# Patient Record
Sex: Male | Born: 1990 | Race: White | Hispanic: No | Marital: Single | Smoking: Current every day smoker
Health system: Southern US, Community
[De-identification: ages and names within clinical notes are randomized; demographics above are authoritative.]

## PROBLEM LIST (undated history)

## (undated) DIAGNOSIS — F319 Bipolar disorder, unspecified: Secondary | ICD-10-CM

## (undated) HISTORY — PX: KNEE SURGERY: SHX244

---
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
## 2015-11-13 ENCOUNTER — Emergency Department (HOSPITAL_COMMUNITY)
Admission: EM | Admit: 2015-11-13 | Discharge: 2015-11-14 | Disposition: A | Discharge status: Home / Self Care | Payer: Medicaid Other | Attending: Emergency Medicine | Admitting: Emergency Medicine

## 2015-11-13 ENCOUNTER — Encounter (HOSPITAL_COMMUNITY): Payer: Self-pay | Admitting: Emergency Medicine

## 2015-11-13 DIAGNOSIS — G478 Other sleep disorders: Secondary | ICD-10-CM | POA: Diagnosis not present

## 2015-11-13 DIAGNOSIS — F17200 Nicotine dependence, unspecified, uncomplicated: Secondary | ICD-10-CM | POA: Diagnosis not present

## 2015-11-13 DIAGNOSIS — F1210 Cannabis abuse, uncomplicated: Secondary | ICD-10-CM | POA: Diagnosis not present

## 2015-11-13 DIAGNOSIS — F4325 Adjustment disorder with mixed disturbance of emotions and conduct: Secondary | ICD-10-CM | POA: Diagnosis present

## 2015-11-13 DIAGNOSIS — R45851 Suicidal ideations: Secondary | ICD-10-CM | POA: Diagnosis present

## 2015-11-13 HISTORY — DX: Bipolar disorder, unspecified: F31.9

## 2015-11-13 LAB — COMPREHENSIVE METABOLIC PANEL
ALBUMIN: 4.4 g/dL (ref 3.5–5.0)
ALT: 22 U/L (ref 17–63)
AST: 27 U/L (ref 15–41)
Alkaline Phosphatase: 65 U/L (ref 38–126)
Anion gap: 11 (ref 5–15)
BUN: 15 mg/dL (ref 6–20)
CHLORIDE: 109 mmol/L (ref 101–111)
CO2: 25 mmol/L (ref 22–32)
Calcium: 9.3 mg/dL (ref 8.9–10.3)
Creatinine, Ser: 0.98 mg/dL (ref 0.61–1.24)
GFR calc Af Amer: >60 mL/min (ref >60)
GFR calc non Af Amer: >60 mL/min (ref >60)
GLUCOSE: 88 mg/dL (ref 65–99)
POTASSIUM: 3.7 mmol/L (ref 3.5–5.1)
SODIUM: 145 mmol/L (ref 135–145)
Total Bilirubin: 0.9 mg/dL (ref 0.3–1.2)
Total Protein: 7.1 g/dL (ref 6.5–8.1)

## 2015-11-13 LAB — CBC
HEMATOCRIT: 40.3 % (ref 39.0–52.0)
HEMOGLOBIN: 13.5 g/dL (ref 13.0–17.0)
MCH: 29.4 pg (ref 26.0–34.0)
MCHC: 33.5 g/dL (ref 30.0–36.0)
MCV: 87.8 fL (ref 78.0–100.0)
Platelets: 183 10*3/uL (ref 150–400)
RBC: 4.59 MIL/uL (ref 4.22–5.81)
RDW: 13.1 % (ref 11.5–15.5)
WBC: 10 10*3/uL (ref 4.0–10.5)

## 2015-11-13 LAB — RAPID URINE DRUG SCREEN, HOSP PERFORMED
Amphetamines: NOT DETECTED
BARBITURATES: NOT DETECTED
Benzodiazepines: NOT DETECTED
Cocaine: NOT DETECTED
Opiates: NOT DETECTED
TETRAHYDROCANNABINOL: POSITIVE — AB

## 2015-11-13 LAB — SALICYLATE LEVEL

## 2015-11-13 LAB — ACETAMINOPHEN LEVEL: Acetaminophen (Tylenol), Serum: <10 ug/mL — ABNORMAL LOW (ref 10–30)

## 2015-11-13 LAB — ETHANOL: Alcohol, Ethyl (B): <5 mg/dL (ref <5)

## 2015-11-13 NOTE — ED Notes (Signed)
Pt brought in by [employer] voluntarily  Pt states he does not care whether or not he lives anymore  Police say there was a family dispute tonight at the residence  Pt told police they could either take him to jail or kill him  Pt is very vague with his answers in triage  Avoids eye contact

## 2015-11-14 DIAGNOSIS — F4325 Adjustment disorder with mixed disturbance of emotions and conduct: Secondary | ICD-10-CM

## 2015-11-14 MED ORDER — ZOLPIDEM TARTRATE 5 MG PO TABS: 5.0000 mg | ORAL_TABLET | Freq: Every evening | ORAL | Status: DC | PRN

## 2015-11-14 MED ORDER — ACETAMINOPHEN 325 MG PO TABS: 650.0000 mg | ORAL_TABLET | ORAL | Status: DC | PRN

## 2015-11-14 MED ORDER — LORAZEPAM 1 MG PO TABS: 1.0000 mg | ORAL_TABLET | Freq: Three times a day (TID) | ORAL | Status: DC | PRN

## 2015-11-14 MED ORDER — ONDANSETRON HCL 4 MG PO TABS
4.0000 mg | ORAL_TABLET | Freq: Three times a day (TID) | ORAL | Status: DC | PRN
Start: 2015-11-14 — End: 2015-11-14

## 2015-11-14 NOTE — Consult Note (Signed)
[facility] Face-to-Face Psychiatry Consult   Reason for Consult:  Suicidal ideations Referring Physician:  EDP Patient Identification: Benjamin Wise MRN:  [id number] Principal Diagnosis: Adjustment disorder with mixed disturbance of emotions and conduct Diagnosis:   Patient Active Problem List   Diagnosis Date Noted  . Adjustment disorder with mixed disturbance of emotions and conduct [F43.25] 11/14/2015    Priority: High    Total Time spent with patient: 45 minutes  Subjective:   Benjamin Wise is a 25 y.o. male patient does not warrant admission.  HPI:  25 yo who presented to the ED with suicidal ideations after having an argument with his family, no prior psychiatric or drug/alcohol history.  He is currently living with his mother and stepfather as he and his wife are going through a divorce, does have joint custody with his 22 month son.  Today, he denies suicidal/homicidal ideations, no past attempts, no hallucinations, and no alcohol/drug abuse.  Encouraged him to go to therapy but he feels he does not need it.  He wants to leave and return to work.  Stable for discharge.  Past Psychiatric History: None  Risk to Self: Suicidal Ideation: Yes-Currently Present Suicidal Intent: Yes-Currently Present Is patient at risk for suicide?: Yes Suicidal Plan?: Yes-Currently Present Specify Current Suicidal Plan: Pt told the police officer to kill him.  Access to Means: Yes What has been your use of drugs/alcohol within the last 12 months?: Pt denies; however UDS is positive for THC.  How many times?: 2 Other Self Harm Risks: Pt denies  Triggers for Past Attempts: Unpredictable Intentional Self Injurious Behavior: None Risk to Others: Homicidal Ideation: No Thoughts of Harm to Others: No Current Homicidal Intent: No Current Homicidal Plan: No Access to Homicidal Means: No Identified Victim: N/A History of harm to others?: No Assessment of Violence: None Noted Violent Behavior  Description: No violent behaviors observed.  Does patient have access to weapons?: No Criminal Charges Pending?: No Does patient have a court date: No Prior Inpatient Therapy: Prior Inpatient Therapy: No Prior Therapy Dates: N/A Prior Therapy Facilty/Provider(s): N/A Reason for Treatment: N/A Prior Outpatient Therapy: Prior Outpatient Therapy: No Prior Therapy Dates: N/A Prior Therapy Facilty/Provider(s): N/A Reason for Treatment: N/A Does patient have an ACCT team?: No Does patient have Intensive In-House Services?  : No Does patient have [facility] services? : No Does patient have P4CC services?: No  Past Medical History:  Past Medical History  Diagnosis Date  . Bipolar disorder [facility])     Past Surgical History  Procedure Laterality Date  . Knee surgery     Family History:  Family History  Problem Relation Age of Onset  . Anxiety disorder Mother    Family Psychiatric  History: None Social History:  History  Alcohol Use No     History  Drug Use  . Yes  . Special: Marijuana    Social History   Social History  . Marital Status: Single    Spouse Name: N/A  . Number of Children: N/A  . Years of Education: N/A   Social History Main Topics  . Smoking status: Current Every Day Smoker  . Smokeless tobacco: None  . Alcohol Use: No  . Drug Use: Yes    Special: Marijuana  . Sexual Activity: Not Asked   Other Topics Concern  . None   Social History Narrative   Additional Social History:    Allergies:  No Known Allergies  Labs:  Results for orders placed or performed during  the hospital encounter of 11/13/15 (from the past 48 hour(s))  Comprehensive metabolic panel     Status: None   Collection Time: 11/13/15 10:51 PM  Result Value Ref Range   Sodium 145 135 - 145 mmol/L   Potassium 3.7 3.5 - 5.1 mmol/L   Chloride 109 101 - 111 mmol/L   CO2 25 22 - 32 mmol/L   Glucose, Bld 88 65 - 99 mg/dL   BUN 15 6 - 20 mg/dL   Creatinine, Ser 0.98 0.61 - 1.24 mg/dL    Calcium 9.3 8.9 - 10.3 mg/dL   Total Protein 7.1 6.5 - 8.1 g/dL   Albumin 4.4 3.5 - 5.0 g/dL   AST 27 15 - 41 U/L   ALT 22 17 - 63 U/L   Alkaline Phosphatase 65 38 - 126 U/L   Total Bilirubin 0.9 0.3 - 1.2 mg/dL   GFR calc non Af Amer >60 >60 mL/min   GFR calc Af Amer >60 >60 mL/min    Comment: (NOTE) The eGFR has been calculated using the CKD EPI equation. This calculation has not been validated in all clinical situations. eGFR's persistently <60 mL/min signify possible Chronic Kidney Disease.    Anion gap 11 5 - 15  Ethanol (ETOH)     Status: None   Collection Time: 11/13/15 10:51 PM  Result Value Ref Range   Alcohol, Ethyl (B) <5 <5 mg/dL    Comment:        LOWEST DETECTABLE LIMIT FOR SERUM ALCOHOL IS 5 mg/dL FOR MEDICAL PURPOSES ONLY   Salicylate level     Status: None   Collection Time: 11/13/15 10:51 PM  Result Value Ref Range   Salicylate Lvl <2.2 2.8 - 30.0 mg/dL  Acetaminophen level     Status: Abnormal   Collection Time: 11/13/15 10:51 PM  Result Value Ref Range   Acetaminophen (Tylenol), Serum <10 (L) 10 - 30 ug/mL    Comment:        THERAPEUTIC CONCENTRATIONS VARY SIGNIFICANTLY. A RANGE OF 10-30 ug/mL MAY BE AN EFFECTIVE CONCENTRATION FOR MANY PATIENTS. HOWEVER, SOME ARE BEST TREATED AT CONCENTRATIONS OUTSIDE THIS RANGE. ACETAMINOPHEN CONCENTRATIONS >150 ug/mL AT 4 HOURS AFTER INGESTION AND >50 ug/mL AT 12 HOURS AFTER INGESTION ARE OFTEN ASSOCIATED WITH TOXIC REACTIONS.   CBC     Status: None   Collection Time: 11/13/15 10:51 PM  Result Value Ref Range   WBC 10.0 4.0 - 10.5 K/uL   RBC 4.59 4.22 - 5.81 MIL/uL   Hemoglobin 13.5 13.0 - 17.0 g/dL   HCT 40.3 39.0 - 52.0 %   MCV 87.8 78.0 - 100.0 fL   MCH 29.4 26.0 - 34.0 pg   MCHC 33.5 30.0 - 36.0 g/dL   RDW 13.1 11.5 - 15.5 %   Platelets 183 150 - 400 K/uL  Urine rapid drug screen (hosp performed) (Not at [facility])     Status: Abnormal   Collection Time: 11/13/15 11:12 PM  Result Value Ref Range    Opiates NONE DETECTED NONE DETECTED   Cocaine NONE DETECTED NONE DETECTED   Benzodiazepines NONE DETECTED NONE DETECTED   Amphetamines NONE DETECTED NONE DETECTED   Tetrahydrocannabinol POSITIVE (A) NONE DETECTED   Barbiturates NONE DETECTED NONE DETECTED    Comment:        DRUG SCREEN FOR MEDICAL PURPOSES ONLY.  IF CONFIRMATION IS NEEDED FOR ANY PURPOSE, NOTIFY LAB WITHIN 5 DAYS.        LOWEST DETECTABLE LIMITS FOR URINE DRUG SCREEN Drug Class  Cutoff (ng/mL) Amphetamine      1000 Barbiturate      200 Benzodiazepine   992 Tricyclics       426 Opiates          300 Cocaine          300 THC              50     Current Facility-Administered Medications  Medication Dose Route Frequency Provider Last Rate Last Dose  . acetaminophen (TYLENOL) tablet 650 mg  650 mg Oral Q4H PRN Ankit Nanavati, MD      . ondansetron (ZOFRAN) tablet 4 mg  4 mg Oral Q8H PRN Ankit Nanavati, MD      . zolpidem (AMBIEN) tablet 5 mg  5 mg Oral QHS PRN Varney Biles, MD       No current outpatient prescriptions on file.    Musculoskeletal: Strength & Muscle Tone: within normal limits Gait & Station: normal Patient leans: N/A  Psychiatric Specialty Exam: Review of Systems  Constitutional: Negative.   HENT: Negative.   Eyes: Negative.   Respiratory: Negative.   Cardiovascular: Negative.   Gastrointestinal: Negative.   Genitourinary: Negative.   Musculoskeletal: Negative.   Skin: Negative.   Neurological: Negative.   Endo/Heme/Allergies: Negative.   Psychiatric/Behavioral: Negative.     Blood pressure 126/77, pulse 54, temperature 98.2 F (36.8 C), temperature source Oral, resp. rate 18, SpO2 100 %.There is no height or weight on file to calculate BMI.  General Appearance: Casual  Eye Contact::  Good  Speech:  Normal Rate  Volume:  Normal  Mood:  Euthymic  Affect:  Congruent  Thought Process:  Coherent  Orientation:  Full (Time, Place, and Person)  Thought Content:  WDL  Suicidal  Thoughts:  No  Homicidal Thoughts:  No  Memory:  Immediate;   Good Recent;   Good Remote;   Good  Judgement:  Fair  Insight:  Fair  Psychomotor Activity:  Normal  Concentration:  Good  Recall:  Good  Fund of Knowledge:Good  Language: Good  Akathisia:  No  Handed:  Right  AIMS (if indicated):     Assets:  Housing Leisure Time Physical Health Resilience Social Support  ADL's:  Intact  Cognition: WNL  Sleep:      Treatment Plan Summary: Daily contact with patient to assess and evaluate symptoms and progress in treatment, Medication management and Plan :  Adjustment disorder with mixed disturbance of emotions and conduct: -Crisis stabilization -No medication recommendation nor does he want any -Individual counseling -Outpatient therapy resources provided with encouragement to attend  Disposition: No evidence of imminent risk to self or others at present.    Waylan Boga, NP 11/14/2015 10:19 AM

## 2015-11-14 NOTE — BH Assessment (Addendum)
Tele Assessment Note   Benjamin Wise is an 25 y.o. male presenting to [facility] reporting suicidal ideations. Pt denies having a plan at this time; however ti has been documented that pt told police officers to either take him to jail or kill him. Pt stated "I got into an argument with some people and I think it would be better if I wasn't here". Pt reported that he has attempted suicide in the past but did not report any previous mental health treatment. PT did not report any issues with his sleep or appetite but reported the following symptoms: sadness, tearfulness, isolation, fatigue, guilt and feeling worthless. Pt denies HI and AVH at this time. Pt denied having access to firearms and did not report any pending criminal charges or upcoming court dates. Pt denied any alcohol or illicit substance abuse; however his UDS is positive for THC. Pt did not report any physical, sexual or emotional abuse at this time.  Inpatient treatment is recommended.  Diagnosis: Bipolar I disorder, current or most recent episode depressed   Past Medical History:  Past Medical History  Diagnosis Date  . Bipolar disorder [facility])     Past Surgical History  Procedure Laterality Date  . Knee surgery      Family History:  Family History  Problem Relation Age of Onset  . Anxiety disorder Mother     Social History:  reports that he has been smoking.  He does not have any smokeless tobacco history on file. He reports that he uses illicit drugs (Marijuana). He reports that he does not drink alcohol.  Additional Social History:  Alcohol / Drug Use History of alcohol / drug use?: No history of alcohol / drug abuse (Pt denies but UDS is positive for THC. )  CIWA: CIWA-Ar BP: 111/66 mmHg Pulse Rate: 86 COWS:    PATIENT STRENGTHS: (choose at least two) Average or above average intelligence Physical Health  Allergies: No Known Allergies  Home Medications:  (Not in a [facility] admission)  OB/GYN Status:  No LMP  for male patient.  General Assessment Data Location of Assessment: [facility] TTS Assessment: In system Is this a Tele or Face-to-Face Assessment?: Face-to-Face Is this an Initial Assessment or a Re-assessment for this encounter?: Initial Assessment Living Arrangements: Non-relatives/Friends Can pt return to current living arrangement?: Yes Admission Status: Voluntary Is patient capable of signing voluntary admission?: Yes Referral Source: Self/Family/Friend Insurance type: Medicaid      Crisis Care Plan Living Arrangements: Non-relatives/Friends Name of Psychiatrist: No provider reported Name of Therapist: No provider reported   Education Status Is patient currently in school?: No Current Grade: N/A Highest grade of school patient has completed: N/A Name of school: N/A Contact person: N/A  Risk to self with the past 6 months Suicidal Ideation: Yes-Currently Present Has patient been a risk to self within the past 6 months prior to admission? : No Suicidal Intent: Yes-Currently Present Has patient had any suicidal intent within the past 6 months prior to admission? : No Is patient at risk for suicide?: Yes Suicidal Plan?: Yes-Currently Present Has patient had any suicidal plan within the past 6 months prior to admission? : No Specify Current Suicidal Plan: Pt told the police officer to kill him.  Access to Means: Yes What has been your use of drugs/alcohol within the last 12 months?: Pt denies; however UDS is positive for THC.  Previous Attempts/Gestures: Yes How many times?: 2 Other Self Harm Risks: Pt denies  Triggers for Past Attempts:  Unpredictable Intentional Self Injurious Behavior: None Family Suicide History: No Recent stressful life event(s): Conflict (Comment) (conflict with others in the home ) Persecutory voices/beliefs?: No Depression: Yes Depression Symptoms: Despondent, Isolating, Tearfulness, Fatigue, Guilt, Feeling worthless/self pity Substance abuse  history and/or treatment for substance abuse?: Yes Suicide prevention information given to non-admitted patients: Not applicable  Risk to Others within the past 6 months Homicidal Ideation: No Does patient have any lifetime risk of violence toward others beyond the six months prior to admission? : No Thoughts of Harm to Others: No Current Homicidal Intent: No Current Homicidal Plan: No Access to Homicidal Means: No Identified Victim: N/A History of harm to others?: No Assessment of Violence: None Noted Violent Behavior Description: No violent behaviors observed.  Does patient have access to weapons?: No Criminal Charges Pending?: No Does patient have a court date: No Is patient on probation?: No  Psychosis Hallucinations: None noted Delusions: None noted  Mental Status Report Appearance/Hygiene: In scrubs Eye Contact: Poor Motor Activity: Freedom of movement Speech: Logical/coherent Level of Consciousness: Quiet/awake Mood: Depressed, Sad Affect: Depressed, Sad Anxiety Level: Minimal Thought Processes: Coherent, Relevant Judgement: Unimpaired Orientation: Appropriate for developmental age Obsessive Compulsive Thoughts/Behaviors: None  Cognitive Functioning Concentration: Normal Memory: Recent Intact, Remote Intact IQ: Average Insight: Poor Impulse Control: Poor Appetite: Good Weight Loss: 0 Weight Gain: 0 Sleep: No Change Total Hours of Sleep: 8 Vegetative Symptoms: None  ADLScreening [facility] Assessment Services) Patient's cognitive ability adequate to safely complete daily activities?: Yes Patient able to express need for assistance with ADLs?: Yes Independently performs ADLs?: Yes (appropriate for developmental age)  Prior Inpatient Therapy Prior Inpatient Therapy: No Prior Therapy Dates: N/A Prior Therapy Facilty/Provider(s): N/A Reason for Treatment: N/A  Prior Outpatient Therapy Prior Outpatient Therapy: No Prior Therapy Dates: N/A Prior Therapy  Facilty/Provider(s): N/A Reason for Treatment: N/A Does patient have an ACCT team?: No Does patient have Intensive In-House Services?  : No Does patient have [facility] services? : No Does patient have P4CC services?: No  ADL Screening (condition at time of admission) Patient's cognitive ability adequate to safely complete daily activities?: Yes Is the patient deaf or have difficulty hearing?: No Does the patient have difficulty seeing, even when wearing glasses/contacts?: No Does the patient have difficulty concentrating, remembering, or making decisions?: No Patient able to express need for assistance with ADLs?: Yes Does the patient have difficulty dressing or bathing?: No Independently performs ADLs?: Yes (appropriate for developmental age)       Abuse/Neglect Assessment (Assessment to be complete while patient is alone) Physical Abuse: Denies Verbal Abuse: Denies Sexual Abuse: Denies Exploitation of patient/patient's resources: Denies Self-Neglect: Denies     Merchant [employer] officer (For Healthcare) Does patient have an advance directive?: No Would patient like information on creating an advanced directive?: No - patient declined information    Additional Information 1:1 In Past 12 Months?: No CIRT Risk: No Elopement Risk: No Does patient have medical clearance?: Yes     Disposition:  Disposition Initial Assessment Completed for this Encounter: Yes Disposition of Patient: Inpatient treatment program Type of inpatient treatment program: Adult  Anyae Griffith S 11/14/2015 1:20 AM

## 2015-11-14 NOTE — ED Notes (Signed)
Pt discharged ambulatory.  Discharge instructions reviewed and all belongings were returned to patient.

## 2015-11-14 NOTE — ED Notes (Signed)
Patient appears anxious, withdrawn. Hesitant to answer questions, not making eye contact. Denies SI, HI, AVH. Very hesitant in answering to HI. Reports VH approx. a year ago. Denies feelings of anxiety. States that he has had depression for years. Denies any changes in weight recently. Reports that he does not feel rested after sleep.   Oriented to the unit. TTS in with patient.  Q 15 safety checks in place.

## 2015-11-14 NOTE — [facility] Suicide Risk Assessment (Signed)
Suicide Risk Assessment  Discharge Assessment   [facility] Discharge Suicide Risk Assessment   Principal Problem: Adjustment disorder with mixed disturbance of emotions and conduct Discharge Diagnoses:  Patient Active Problem List   Diagnosis Date Noted  . Adjustment disorder with mixed disturbance of emotions and conduct [F43.25] 11/14/2015    Priority: High    Total Time spent with patient: 45 minutes  Musculoskeletal: Strength & Muscle Tone: within normal limits Gait & Station: normal Patient leans: N/A  Psychiatric Specialty Exam: Review of Systems  Constitutional: Negative.   HENT: Negative.   Eyes: Negative.   Respiratory: Negative.   Cardiovascular: Negative.   Gastrointestinal: Negative.   Genitourinary: Negative.   Musculoskeletal: Negative.   Skin: Negative.   Neurological: Negative.   Endo/Heme/Allergies: Negative.   Psychiatric/Behavioral: Negative.     Blood pressure 126/77, pulse 54, temperature 98.2 F (36.8 C), temperature source Oral, resp. rate 18, SpO2 100 %.There is no height or weight on file to calculate BMI.  General Appearance: Casual  Eye Contact::  Good  Speech:  Normal Rate  Volume:  Normal  Mood:  Euthymic  Affect:  Congruent  Thought Process:  Coherent  Orientation:  Full (Time, Place, and Person)  Thought Content:  WDL  Suicidal Thoughts:  No  Homicidal Thoughts:  No  Memory:  Immediate;   Good Recent;   Good Remote;   Good  Judgement:  Fair  Insight:  Fair  Psychomotor Activity:  Normal  Concentration:  Good  Recall:  Good  Fund of Knowledge:Good  Language: Good  Akathisia:  No  Handed:  Right  AIMS (if indicated):     Assets:  Housing Leisure Time Physical Health Resilience Social Support  ADL's:  Intact  Cognition: WNL  Sleep:        Mental Status Per Nursing Assessment::   On Admission:   Suicidal ideations  Demographic Factors:  Male  Loss Factors: Loss of significant relationship  Historical  Factors: NA  Risk Reduction Factors:   Responsible for children under 47 years of age, Sense of responsibility to family, Employed, Living with another person, especially a relative, Positive social support and Positive coping skills or problem solving skills  Continued Clinical Symptoms:  None  Cognitive Features That Contribute To Risk:  None    Suicide Risk:  Minimal: No identifiable suicidal ideation.  Patients presenting with no risk factors but with morbid ruminations; may be classified as minimal risk based on the severity of the depressive symptoms    Plan Of Care/Follow-up recommendations:  Activity:  as tolerated Diet:  heart healthy diet  LORD, JAMISON, NP 11/14/2015, 10:27 AM

## 2015-11-14 NOTE — Discharge Instructions (Signed)
  Adjustment Disorder Adjustment disorder is an unusually severe reaction to a stressful life event, such as the loss of a job or physical illness. The event may be any stressful event other than the loss of a loved one. Adjustment disorder may affect your feelings, your thinking, how you act, or a combination of these. It may interfere with personal relationships or with the way you are at work, school, or home. People with this disorder are at risk for suicide and substance abuse. They may develop a more serious mental disorder, such as major depressive disorder or post-traumatic stress disorder. SIGNS AND SYMPTOMS  Symptoms may include:  Sadness, depressed mood, or crying spells.  Loss of enjoyment.  Change in appetite or weight.  Sense of loss or hopelessness.  Thoughts of suicide.  Anxiety, worry, or nervousness.  Trouble sleeping.  Avoiding family and friends.  Poor school performance.  Fighting or vandalism.  Reckless driving.  Skipping school.  Poor work performance.  Ignoring bills. Symptoms of adjustment disorder start within 3 months of the stressful life event. They do not last more than 6 months after the event has ended. DIAGNOSIS  To make a diagnosis, your health care provider will ask about what has happened in your life and how it has affected you. He or she may also ask about your medical history and use of medicines, alcohol, and other substances. Your health care provider may do a physical exam and order lab tests or other studies. You may be referred to a mental health specialist for evaluation. TREATMENT  Treatment options include:  Counseling or talk therapy. Talk therapy is usually provided by mental health specialists.  Medicine. Certain medicines may help with depression, anxiety, and sleep.  Support groups. Support groups offer emotional support, advice, and guidance. They are made up of people who have had similar experiences. HOME CARE  INSTRUCTIONS  Keep all follow-up visits as directed by your health care provider. This is important.  Take medicines only as directed by your health care provider. SEEK MEDICAL CARE IF:  Your symptoms get worse.  SEEK IMMEDIATE MEDICAL CARE IF: You have serious thoughts about hurting yourself or someone else. MAKE SURE YOU:  Understand these instructions.  Will watch your condition.  Will get help right away if you are not doing well or get worse.   This information is not intended to replace advice given to you by your health care provider. Make sure you discuss any questions you have with your health care provider.   Document Released: 05/11/2006 Document Revised: 09/27/2014 Document Reviewed: 01/29/2014 Elsevier Interactive Patient Education 2016 [employer]  

## 2015-11-14 NOTE — BH Assessment (Signed)
Assessment completed. Consulted  Hulan Fess, NP who recommended inpatient treatment. TTS to seek placement. Dr. Rhunette Croft of the recommendation.

## 2015-11-14 NOTE — ED Provider Notes (Signed)
CSN: [id number]     Arrival date & time 11/13/15  2100 History   First MD Initiated Contact with Patient 11/13/15 2134     Chief Complaint  Patient presents with  . Suicidal     (Consider location/radiation/quality/duration/timing/severity/associated sxs/prior Treatment) HPI Comments: PT comes in VOLUNTARILY with SI. Pt has hx of depression and bipolar disorder. Pt reports that his wife started his sleeping with another man, and she will likely file for divorce and get child custody -which has made his symptoms worse. He always has been depressed, but now he really feels hopeless.  The history is provided by the patient.    Past Medical History  Diagnosis Date  . Bipolar disorder [facility])    Past Surgical History  Procedure Laterality Date  . Knee surgery     Family History  Problem Relation Age of Onset  . Anxiety disorder Mother    Social History  Substance Use Topics  . Smoking status: Current Every Day Smoker  . Smokeless tobacco: None  . Alcohol Use: No    Review of Systems  Constitutional: Negative for activity change.  Cardiovascular: Negative for chest pain.  Gastrointestinal: Negative for nausea.  Psychiatric/Behavioral: Positive for suicidal ideas and sleep disturbance. Negative for self-injury.      Allergies  Review of patient's allergies indicates no known allergies.  Home Medications   Prior to Admission medications   Not on File   BP 109/65 mmHg  Pulse 73  Temp(Src) 98 F (36.7 C) (Oral)  Resp 16  SpO2 98% Physical Exam  Constitutional: He is oriented to person, place, and time. He appears well-developed.  HENT:  Head: Atraumatic.  Neck: Neck supple.  Cardiovascular: Normal rate.   Pulmonary/Chest: Effort normal.  Neurological: He is alert and oriented to person, place, and time.  Skin: Skin is warm.  Psychiatric:  Flat affect  Nursing note and vitals reviewed.   ED Course  Procedures (including critical care time) Labs  Review Labs Reviewed  ACETAMINOPHEN LEVEL - Abnormal; Notable for the following:    Acetaminophen (Tylenol), Serum <10 (*)    All other components within normal limits  URINE RAPID DRUG SCREEN, HOSP PERFORMED - Abnormal; Notable for the following:    Tetrahydrocannabinol POSITIVE (*)    All other components within normal limits  COMPREHENSIVE METABOLIC PANEL  ETHANOL  SALICYLATE LEVEL  CBC    Imaging Review No results found. I have personally reviewed and evaluated these images and lab results as part of my medical decision-making.   EKG Interpretation None      MDM   Final diagnoses:  Suicidal ideation    PT comes in with cc of SI. He has flat affect. Will need TTS evaluation. Medically cleared.     Derwood Kaplan, MD 11/14/15 [id number]

## 2015-11-14 NOTE — ED Notes (Signed)
Bed: [facility] Expected date: 11/14/15 Expected time: 12:37 AM Means of arrival:  Comments: Hold for [facility]

## 2018-09-27 ENCOUNTER — Emergency Department (HOSPITAL_COMMUNITY)
Admission: EM | Admit: 2018-09-27 | Discharge: 2018-09-27 | Disposition: A | Discharge status: Home / Self Care | Payer: Self-pay | Attending: Emergency Medicine | Admitting: Emergency Medicine

## 2018-09-27 ENCOUNTER — Other Ambulatory Visit: Payer: Self-pay

## 2018-09-27 ENCOUNTER — Emergency Department (HOSPITAL_COMMUNITY): Payer: Self-pay

## 2018-09-27 ENCOUNTER — Encounter (HOSPITAL_COMMUNITY): Payer: Self-pay | Admitting: Emergency Medicine

## 2018-09-27 DIAGNOSIS — R1031 Right lower quadrant pain: Secondary | ICD-10-CM | POA: Insufficient documentation

## 2018-09-27 DIAGNOSIS — F1210 Cannabis abuse, uncomplicated: Secondary | ICD-10-CM | POA: Insufficient documentation

## 2018-09-27 DIAGNOSIS — F17200 Nicotine dependence, unspecified, uncomplicated: Secondary | ICD-10-CM | POA: Insufficient documentation

## 2018-09-27 DIAGNOSIS — L0291 Cutaneous abscess, unspecified: Secondary | ICD-10-CM | POA: Insufficient documentation

## 2018-09-27 LAB — CBC WITH DIFFERENTIAL/PLATELET
Abs Immature Granulocytes: 0.03 10*3/uL (ref 0.00–0.07)
Basophils Absolute: 0.2 10*3/uL — ABNORMAL HIGH (ref 0.0–0.1)
Basophils Relative: 2 %
EOS PCT: 2 %
Eosinophils Absolute: 0.2 10*3/uL (ref 0.0–0.5)
HEMATOCRIT: 44.4 % (ref 39.0–52.0)
HEMOGLOBIN: 14.7 g/dL (ref 13.0–17.0)
Immature Granulocytes: 0 %
LYMPHS ABS: 2.2 10*3/uL (ref 0.7–4.0)
LYMPHS PCT: 22 %
MCH: 30.2 pg (ref 26.0–34.0)
MCHC: 33.1 g/dL (ref 30.0–36.0)
MCV: 91.2 fL (ref 80.0–100.0)
MONO ABS: 1.1 10*3/uL — AB (ref 0.1–1.0)
Monocytes Relative: 12 %
Neutro Abs: 6.1 10*3/uL (ref 1.7–7.7)
Neutrophils Relative %: 62 %
Platelets: 144 10*3/uL — ABNORMAL LOW (ref 150–400)
RBC: 4.87 MIL/uL (ref 4.22–5.81)
RDW: 12.3 % (ref 11.5–15.5)
WBC: 9.8 10*3/uL (ref 4.0–10.5)
nRBC: 0 % (ref 0.0–0.2)

## 2018-09-27 LAB — COMPREHENSIVE METABOLIC PANEL
ALK PHOS: 50 U/L (ref 38–126)
ALT: 22 U/L (ref 0–44)
AST: 23 U/L (ref 15–41)
Albumin: 3.9 g/dL (ref 3.5–5.0)
Anion gap: 7 (ref 5–15)
BUN: 16 mg/dL (ref 6–20)
CALCIUM: 9.3 mg/dL (ref 8.9–10.3)
CO2: 23 mmol/L (ref 22–32)
Chloride: 107 mmol/L (ref 98–111)
Creatinine, Ser: 0.76 mg/dL (ref 0.61–1.24)
GFR calc non Af Amer: >60 mL/min (ref >60)
Glucose, Bld: 91 mg/dL (ref 70–99)
Potassium: 4.5 mmol/L (ref 3.5–5.1)
SODIUM: 137 mmol/L (ref 135–145)
Total Bilirubin: 0.7 mg/dL (ref 0.3–1.2)
Total Protein: 6.8 g/dL (ref 6.5–8.1)

## 2018-09-27 MED ORDER — HYDROCODONE-ACETAMINOPHEN 5-325 MG PO TABS
1.0000 | ORAL_TABLET | Freq: Once | ORAL | Status: AC | PRN
  Administered 2018-09-27: 1 via ORAL
  Filled 2018-09-27: qty 1

## 2018-09-27 MED ORDER — DOXYCYCLINE HYCLATE 100 MG PO CAPS: 100.0000 mg | ORAL_CAPSULE | Freq: Two times a day (BID) | ORAL | 0 refills | Status: AC

## 2018-09-27 MED ORDER — IOHEXOL 300 MG/ML  SOLN
100.0000 mL | Freq: Once | INTRAMUSCULAR | Status: AC | PRN
  Administered 2018-09-27: 100 mL via INTRAVENOUS

## 2018-09-27 MED ORDER — LIDOCAINE-EPINEPHRINE (PF) 2 %-1:200000 IJ SOLN
10.0000 mL | Freq: Once | INTRAMUSCULAR | Status: AC
  Administered 2018-09-27: 10 mL
  Filled 2018-09-27: qty 20

## 2018-09-27 MED ORDER — BACITRACIN ZINC 500 UNIT/GM EX OINT
TOPICAL_OINTMENT | Freq: Once | CUTANEOUS | Status: AC
  Administered 2018-09-27: 23:00:00 via TOPICAL
  Filled 2018-09-27: qty 0.9

## 2018-09-27 MED ORDER — IBUPROFEN 800 MG PO TABS: 800.0000 mg | ORAL_TABLET | Freq: Three times a day (TID) | ORAL | 0 refills | Status: AC | PRN

## 2018-09-27 MED ORDER — MORPHINE SULFATE (PF) 4 MG/ML IV SOLN
4.0000 mg | Freq: Once | INTRAVENOUS | Status: AC
  Administered 2018-09-27: 4 mg via INTRAVENOUS
  Filled 2018-09-27: qty 1

## 2018-09-27 NOTE — ED Triage Notes (Signed)
Pt reports that he has a "knot" in his lower abdomen x 1 year, reports that it has gotten bigger and more painful in the last few weeks. Denies changes to bowel movements.

## 2018-09-27 NOTE — ED Notes (Signed)
To ct

## 2018-09-27 NOTE — Discharge Instructions (Addendum)
It was my pleasure taking care of you today!   Please take all of your antibiotics until finished!  Call the surgery clinic in the morning to schedule a follow up appointment.   Return to ER for fever, new or worsening symptoms, any additional concerns.

## 2018-09-27 NOTE — ED Notes (Signed)
Waiting for ct scan

## 2018-09-27 NOTE — ED Notes (Signed)
The pt has had a swollen area to his rt groin area for one year  It has been growing

## 2018-09-27 NOTE — ED Provider Notes (Signed)
[facility] Provider Note   CSN: [id number] Arrival date & time: 09/27/18  1643     History   Chief Complaint Chief Complaint  Patient presents with  . Abdominal Pain    HPI Benjamin Wise is a 28 y.o. male.  The history is provided by the patient and medical records. No language interpreter was used.  Abdominal Pain   Benjamin Wise is a 28 y.o. male  with a PMH as listed below who presents to the Emergency Department complaining of worsening not to the right lower abdomen.  Patient states he initially noticed this area in March 2019.  It was about the size of his fingernail.  It was firm and nonmobile, but was not causing him any discomfort.  He just ignored it as it was not causing him any pain.  Over the last 3 to 4 days, the area has been growing in size and becoming more tender and painful.  Pain is worse with any movement.  He has not noticed any alleviating factors or tried any medications.  Denies any history of similar.  No fever or chills.  No nausea, vomiting, diarrhea or constipation.  No previous abdominal surgeries.  Past Medical History:  Diagnosis Date  . Bipolar disorder [facility](HCC)     Patient Active Problem List   Diagnosis Date Noted  . 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 KNEE SURGERY          Home Medications    Prior to Admission medications   Medication Sig Start Date End Date Taking? Authorizing Provider  acetaminophen (TYLENOL) 500 MG tablet Take 1,000 mg by mouth every 6 (six) hours as needed.   Yes [provider]  naproxen sodium (ALEVE) 220 MG tablet Take 220 mg by mouth every 8 (eight) hours as needed (pain).   Yes [provider]  doxycycline (VIBRAMYCIN) 100 MG capsule Take 1 capsule (100 mg total) by mouth 2 (two) times daily. 09/27/18   , Chase PicketJaime Pilcher, PA-C  ibuprofen (ADVIL,MOTRIN)  800 MG tablet Take 1 tablet (800 mg total) by mouth every 8 (eight) hours as needed. 09/27/18   , Chase PicketJaime Pilcher, PA-C    Family History Family History  Problem Relation Age of Onset  . Anxiety disorder Mother     Social History Social History   Tobacco Use  . Smoking status: Current Every Day Smoker  . Smokeless tobacco: Never Used  Substance Use Topics  . Alcohol use: Yes  . Drug use: Yes    Types: Marijuana     Allergies   Patient has no known allergies.   Review of Systems Review of Systems  Gastrointestinal: Positive for abdominal pain.  Skin: Negative for wound.  All other systems reviewed and are negative.    Physical Exam Updated Vital Signs BP 120/66   Pulse 68   Temp 98.3 F (36.8 C) (Oral)   Resp 16   SpO2 98%   Physical Exam Vitals signs and nursing note reviewed.  Constitutional:      General: He is not in acute distress.    Appearance: He is well-developed.  HENT:     Head: Normocephalic and atraumatic.  Cardiovascular:     Rate and Rhythm: Normal rate and regular rhythm.     Heart sounds: Normal heart sounds. No murmur.  Pulmonary:     Effort: Pulmonary effort is normal.  No respiratory distress.     Breath sounds: Normal breath sounds.  Abdominal:     General: There is no distension.     Palpations: Abdomen is soft.     Comments: 3x3 area of induration and tenderness to the right lower quadrant.  No open wounds or drainage.  Mild overlying erythema.  Skin:    General: Skin is warm and dry.  Neurological:     Mental Status: He is alert and oriented to person, place, and time.      ED Treatments / Results  Labs (all labs ordered are listed, but only abnormal results are displayed) Labs Reviewed  CBC WITH DIFFERENTIAL/PLATELET - Abnormal; Notable for the following components:      Result Value   Platelets 144 (*)    Monocytes Absolute 1.1 (*)    Basophils Absolute 0.2 (*)    All other components within normal limits    COMPREHENSIVE METABOLIC PANEL    EKG None  Radiology Ct Abdomen Pelvis W Contrast  Result Date: 09/27/2018 CLINICAL DATA:  Tender masslike area to right lower quadrant. Patient reports been present for 10 months, increased size over the past 4 days with pain. EXAM: CT ABDOMEN AND PELVIS WITH CONTRAST TECHNIQUE: Multidetector CT imaging of the abdomen and pelvis was performed using the standard protocol following bolus administration of intravenous contrast. CONTRAST:  100mL OMNIPAQUE IOHEXOL 300 MG/ML  SOLN COMPARISON:  None. FINDINGS: Lower chest: The lung bases are clear. Hepatobiliary: No focal liver abnormality is seen. No gallstones, gallbladder wall thickening, or biliary dilatation. Pancreas: No ductal dilatation or inflammation. Spleen: Normal in size without focal abnormality. Adrenals/Urinary Tract: Normal adrenal glands. No hydronephrosis or perinephric edema. Homogeneous renal enhancement with symmetric excretion on delayed phase imaging. No suspicious renal mass. Urinary bladder is physiologically distended without wall thickening. Stomach/Bowel: Bowel evaluation is limited in the absence of enteric contrast. Ingested material in the stomach. No bowel wall thickening, inflammatory change, or obstruction. Normal appendix. Moderate colonic stool burden. No colonic wall thickening or inflammatory change. Vascular/Lymphatic: Enlarged right inguinal node measures 1.5 cm short axis. Additional smaller nodes in the right inguinal and external iliac stations. No retroperitoneal adenopathy. Normal caliber abdominal aorta. Mesenteric vessels and portal vein are intact. Reproductive: Prostate is unremarkable. Other: Within the subcutaneous tissues of the anterior right lower abdominal wall is a 1.4 x 1.9 cm peripherally enhancing ovoid complex fluid collection with surrounding soft tissue stranding. No internal air. This appears centered in the subcutaneous tissues rather than the dermis. Small fat  containing umbilical and supraumbilical ventral abdominal wall hernias without bowel involvement or inflammatory change. No free fluid or free air in the abdomen or pelvis. Musculoskeletal: There are no acute or suspicious osseous abnormalities. IMPRESSION: 1. Complex 1.4 x 1.9 cm peripherally enhancing fluid collection with surrounding soft tissue stranding in the right lower anterior abdominal wall subcutaneous tissues. Findings are suspicious for abscess. Superimposed infection or necrosis of a pre-existing subcutaneous lesion is also considered given this is reportedly been present for months. Recommend fluid/tissue sampling. 2. Right inguinal and external iliac adenopathy is likely reactive. 3. Small fat containing umbilical and supraumbilical ventral abdominal wall hernias without bowel involvement or inflammatory change. Electronically Signed   By: Narda RutherfordMelanie  Sanford M.D.   On: 09/27/2018 19:49    Procedures .Marland Kitchen.Incision and Drainage Date/Time: 09/27/2018 10:24 PM Performed by: , Chase PicketJaime Pilcher, PA-C Authorized by: , Chase PicketJaime Pilcher, PA-C   Consent:    Consent obtained:  Verbal   Consent given by:  Patient   Risks discussed:  Bleeding, incomplete drainage, pain and infection Location:    Type:  Abscess   Location:  Trunk   Trunk location:  Abdomen Pre-procedure details:    Skin preparation:  Betadine Anesthesia (see MAR for exact dosages):    Anesthesia method:  Local infiltration   Local anesthetic:  Lidocaine 2% WITH epi Procedure type:    Complexity:  Complex Procedure details:    Incision types:  Stab incision   Scalpel blade:  11   Wound management:  Probed and deloculated and irrigated with saline   Drainage:  Purulent and bloody   Drainage amount:  Moderate   Packing materials:  1/4 in gauze Post-procedure details:    Patient tolerance of procedure:  Tolerated well, no immediate complications   (including critical care time)  Medications Ordered in ED Medications    morphine 4 MG/ML injection 4 mg (has no administration in time range)  bacitracin ointment (has no administration in time range)  HYDROcodone-acetaminophen (NORCO/VICODIN) 5-325 MG per tablet 1 tablet (1 tablet Oral Given 09/27/18 1729)  iohexol (OMNIPAQUE) 300 MG/ML solution 100 mL (100 mLs Intravenous Contrast Given 09/27/18 1922)  lidocaine-EPINEPHrine (XYLOCAINE W/EPI) 2 %-1:200000 (PF) injection 10 mL (10 mLs Infiltration Given 09/27/18 2115)     Initial Impression / Assessment and Plan / ED Course  I have reviewed the triage vital signs and the nursing notes.  Pertinent labs & imaging results that were available during my care of the patient were reviewed by me and considered in my medical decision making (see chart for details).    KILEY OVERDORF is a 28 y.o. male who presents to ED for progressively worsening knot to the right lower abdomen.  The area has been present for several months, but acutely became swollen and painful 3 or 4 days ago.  Afebrile.  Hemodynamically stable.  Labs reviewed and reassuring. CT obtained showing a complex fluid collection in the right lower anterior abdominal wall tissue suspicious for abscess.  Superimposed infection or necrosis of pre-existing lesion also considered given history of the lesion being present for several months.  Per radiology, fluid/tissue sampling recommended.  I discussed case with on-call general surgery, Dr. Luisa Hart, who has independently reviewed CT scan.  He believes this is likely an infected sebaceous cyst.  Recommended bedside I&D in the emergency department, starting patient on doxycycline, and having him follow-up in clinic.  I&D performed as dictated above.  Rx for doxycycline given.  Follow-up care recommendations discussed and included in discharge paperwork.  Reasons to return to the emergency department were discussed as well.  All questions were answered.  Patient seen by and discussed with Dr. Pilar Plate prior to imaging who agrees  with treatment plan. Patient was then seen by Dr. Lynelle Doctor after procedure.    Final Clinical Impressions(s) / ED Diagnoses   Final diagnoses:  Abscess    ED Discharge Orders         Ordered    doxycycline (VIBRAMYCIN) 100 MG capsule  2 times daily     09/27/18 2214    ibuprofen (ADVIL,MOTRIN) 800 MG tablet  Every 8 hours PRN     09/27/18 2214           , Chase Picket, PA-C 09/27/18 2232    Sabas Sous, MD 09/29/18 [id number]

## 2018-09-30 ENCOUNTER — Encounter (HOSPITAL_COMMUNITY): Payer: Self-pay | Admitting: Emergency Medicine

## 2018-09-30 ENCOUNTER — Emergency Department (HOSPITAL_COMMUNITY)
Admission: EM | Admit: 2018-09-30 | Discharge: 2018-09-30 | Disposition: A | Discharge status: Home / Self Care | Payer: Self-pay | Attending: Emergency Medicine | Admitting: Emergency Medicine

## 2018-09-30 DIAGNOSIS — F1210 Cannabis abuse, uncomplicated: Secondary | ICD-10-CM | POA: Insufficient documentation

## 2018-09-30 DIAGNOSIS — Z5189 Encounter for other specified aftercare: Secondary | ICD-10-CM | POA: Insufficient documentation

## 2018-09-30 DIAGNOSIS — F17200 Nicotine dependence, unspecified, uncomplicated: Secondary | ICD-10-CM | POA: Insufficient documentation

## 2018-09-30 DIAGNOSIS — L723 Sebaceous cyst: Secondary | ICD-10-CM | POA: Insufficient documentation

## 2018-09-30 MED ORDER — BACITRACIN ZINC 500 UNIT/GM EX OINT
TOPICAL_OINTMENT | Freq: Once | CUTANEOUS | Status: AC
  Administered 2018-09-30: 2 via TOPICAL
  Filled 2018-09-30: qty 1.8

## 2018-09-30 NOTE — ED Provider Notes (Signed)
[facility]-EMERGENCY DEPT Provider Note   CSN: [id number] Arrival date & time: 09/30/18  1458     History   Chief Complaint Chief Complaint  Patient presents with  . Wound Check    HPI Benjamin Wise is a 28 y.o. male.  The history is provided by the patient and medical records. No language interpreter was used.  Wound Check    Benjamin Wise is a 28 year old male presents the emergency department for wound check.  I saw this patient on the eighth where he presented with likely sebaceous cyst which had become infected.  I performed an I&D and placed packing to the area.  Patient states that he has been taking his antibiotics as directed without any missed doses and the area of swelling is going down a good bit.  Today, he was changing his dressing and part of the packing got stuck on the gauze.  When he pulled the gauze off, the packing came out.  He thought there was much more packing in the cavity then came out, therefore he is concerned that some of the packing still remains inside the cavity.  No fever or chills.  No worsening symptoms.  Past Medical History:  Diagnosis Date  . Bipolar disorder [facility](HCC)     Patient Active Problem List   Diagnosis Date Noted  . 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 KNEE SURGERY          Home Medications    Prior to Admission medications   Medication Sig Start Date End Date Taking? Authorizing Provider  acetaminophen (TYLENOL) 500 MG tablet Take 1,000 mg by mouth every 6 (six) hours as needed.    [provider]  doxycycline (VIBRAMYCIN) 100 MG capsule Take 1 capsule (100 mg total) by mouth 2 (two) times daily. 09/27/18   , Chase PicketJaime Pilcher, PA-C  ibuprofen (ADVIL,MOTRIN) 800 MG tablet Take 1 tablet (800 mg total) by mouth every 8 (eight) hours as needed. 09/27/18   , Chase PicketJaime Pilcher, PA-C  naproxen sodium  (ALEVE) 220 MG tablet Take 220 mg by mouth every 8 (eight) hours as needed (pain).    [provider]    Family History Family History  Problem Relation Age of Onset  . Anxiety disorder Mother     Social History Social History   Tobacco Use  . Smoking status: Current Every Day Smoker  . Smokeless tobacco: Never Used  Substance Use Topics  . Alcohol use: Yes  . Drug use: Yes    Types: Marijuana     Allergies   Patient has no known allergies.   Review of Systems Review of Systems  Skin: Positive for wound.  All other systems reviewed and are negative.    Physical Exam Updated Vital Signs BP 140/87 (BP Location: Left Arm)   Pulse 87   Temp 97.9 F (36.6 C) (Oral)   Resp 19   SpO2 100%   Physical Exam Vitals signs and nursing note reviewed.  Constitutional:      General: He is not in acute distress.    Appearance: He is well-developed.  HENT:     Head: Normocephalic and atraumatic.  Neck:     Musculoskeletal: Neck supple.  Cardiovascular:     Rate and Rhythm: Normal rate and regular rhythm.     Heart sounds: Normal heart sounds. No murmur.  Pulmonary:     Effort:  Pulmonary effort is normal. No respiratory distress.     Breath sounds: Normal breath sounds. No wheezing or rales.  Abdominal:     Comments: Wound to the right lower quadrant s/p I&D which is healing well.  No surrounding erythema.  Much smaller area of induration than last encounter.   Musculoskeletal: Normal range of motion.  Skin:    General: Skin is warm and dry.  Neurological:     Mental Status: He is alert.      ED Treatments / Results  Labs (all labs ordered are listed, but only abnormal results are displayed) Labs Reviewed - No data to display  EKG None  Radiology No results found.  Procedures Procedures (including critical care time)  Medications Ordered in ED Medications  bacitracin ointment (has no administration in time range)     Initial Impression /  Assessment and Plan / ED Course  I have reviewed the triage vital signs and the nursing notes.  Pertinent labs & imaging results that were available during my care of the patient were reviewed by me and considered in my medical decision making (see chart for details).     Benjamin Wise is a 28 y.o. male who presents to ED for wound check.  I saw this patient on January 8 and performed I and D to complex abscess on the abdominal wall.  Patient states that the wound is healing well.  The packing came out today and he is concerned that there is additional packing left in the cavity.  I thoroughly irrigated the cavity and probed.  There is no packing inside.  It appears much smaller than when I had seen it previously.  Appears to be healing quite well.  He has not called the surgery clinic yet.  I discussed the importance of doing this on Monday and again provided him with the contact information.  We discussed reasons to return to the emergency department and continued wound care/antibiotic use.  All questions answered.   Final Clinical Impressions(s) / ED Diagnoses   Final diagnoses:  Visit for wound check    ED Discharge Orders    None       , Chase PicketJaime Pilcher, PA-C 09/30/18 1538    Jacalyn LefevreHaviland, Julie, MD 09/30/18 1550

## 2018-09-30 NOTE — Discharge Instructions (Signed)
It was my pleasure taking care of you today!   Call the surgery clinic on Monday to schedule a follow up appointment.   Continue to take your antibiotics until completion.   Return to ER for new or worsening symptoms, any additional concerns.

## 2018-09-30 NOTE — ED Triage Notes (Signed)
Pt reports had abd abscess drained at [facility] ED couple days ago and had it packed. Pt reports that when doing dressing change today part of the packing got stuck in abscess. Pt reports pain with certain movements.

## 2019-03-11 IMAGING — CT CT ABD-PELV W/ CM
2 of 5 series · 16 of 46 positions shown, 18 images · IV contrast (Omni 300)
Comparison: None.

CLINICAL DATA: Tender masslike area to right lower quadrant.
Patient reports been present for 10 months, increased size over the
past 4 days with pain.

EXAM:
CT ABDOMEN AND PELVIS WITH CONTRAST
TECHNIQUE: Multidetector CT imaging of the abdomen and pelvis was performed
using the standard protocol following bolus administration of
intravenous contrast.
CONTRAST:  100mL OMNIPAQUE IOHEXOL 300 MG/ML  SOLN

[Series 3: a/p w/ 5mm · axial · 0.83mm/px · z∈[-570,-95]mm · 13 of 107 slices shown, 15 images]
[im 6/107  soft-tissue]
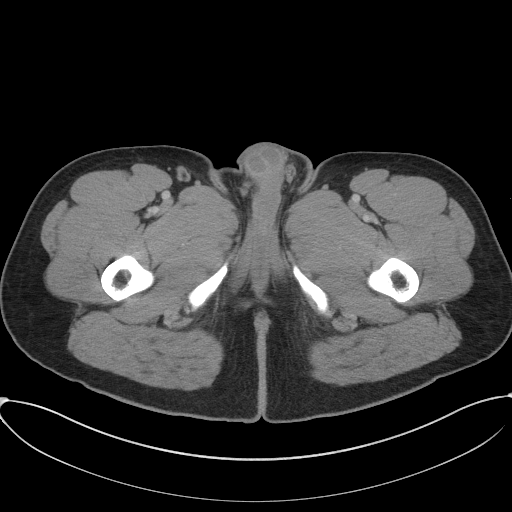
[im 6/107  bone]
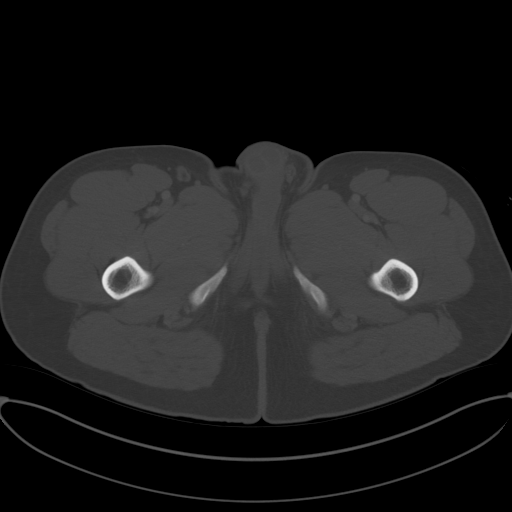
[im 17/107  soft-tissue]
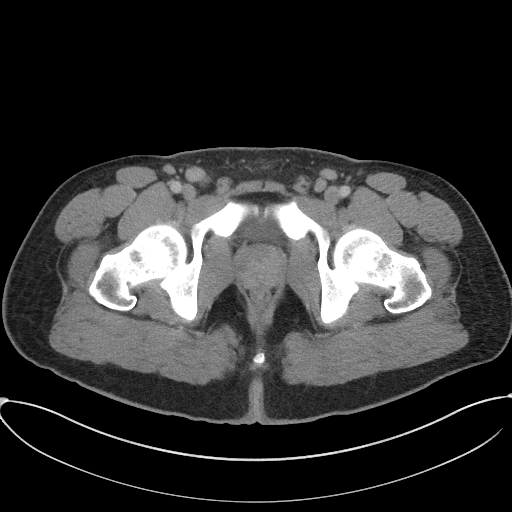
[im 23/107  soft-tissue]
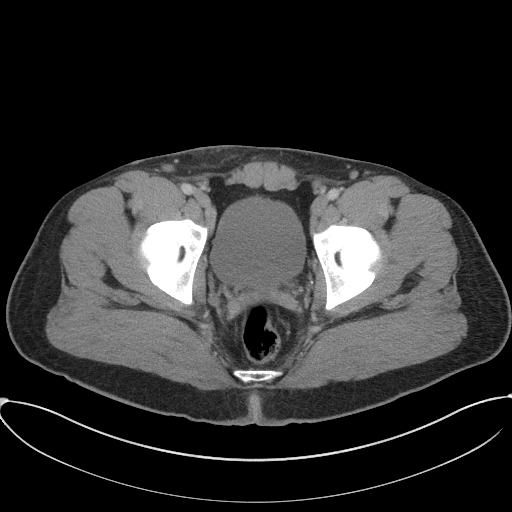
[im 28/107  soft-tissue]
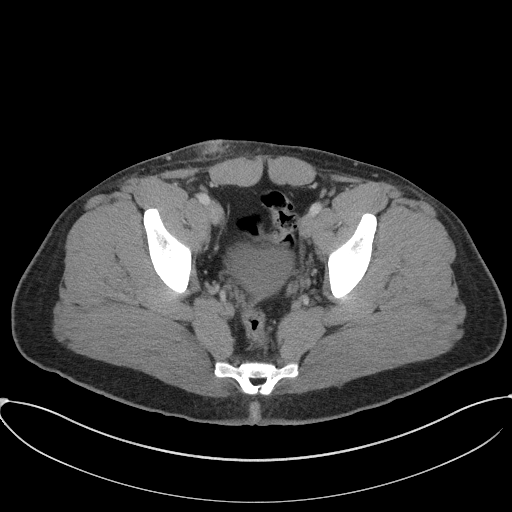
[im 40/107  soft-tissue]
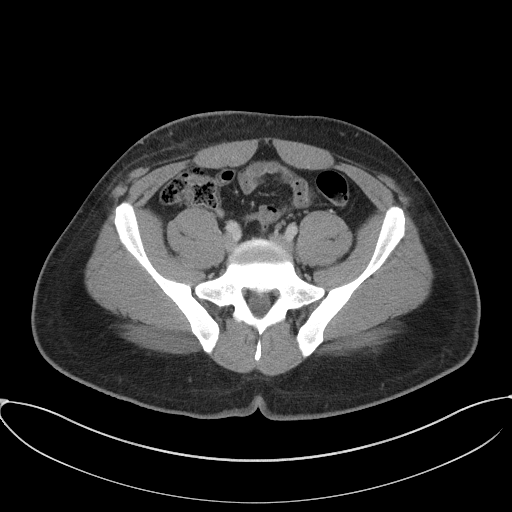
[im 45/107  soft-tissue]
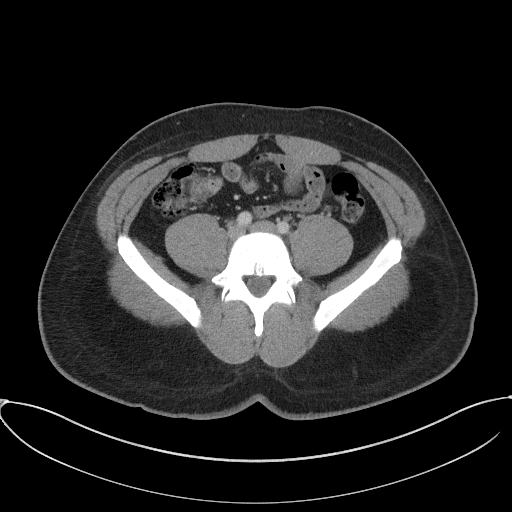
[im 56/107  soft-tissue]
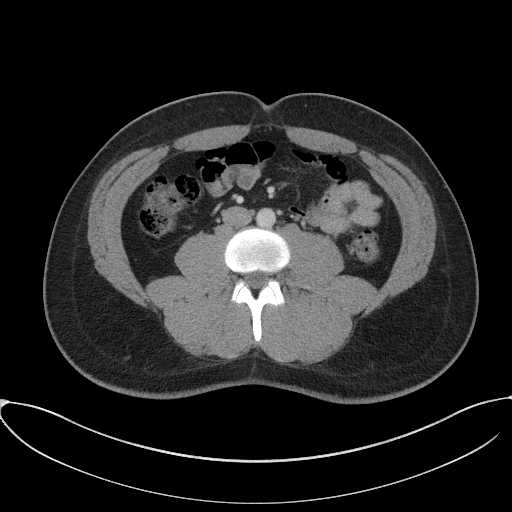
[im 62/107  soft-tissue]
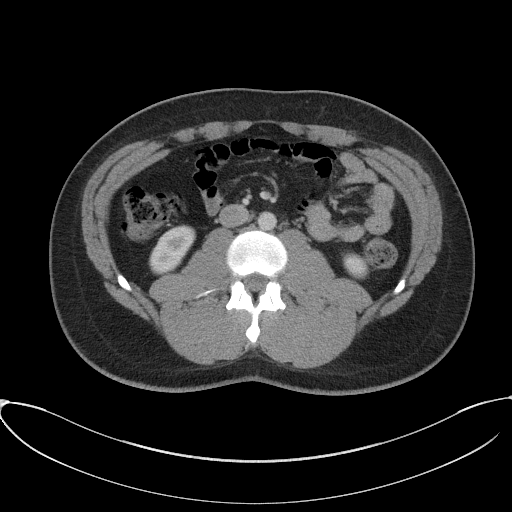
[im 67/107  soft-tissue]
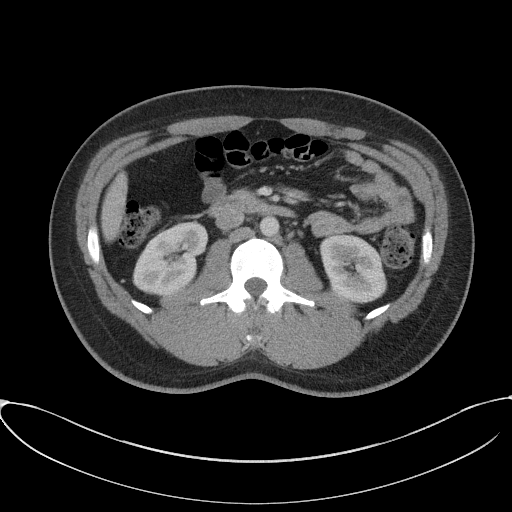
[im 67/107  bone]
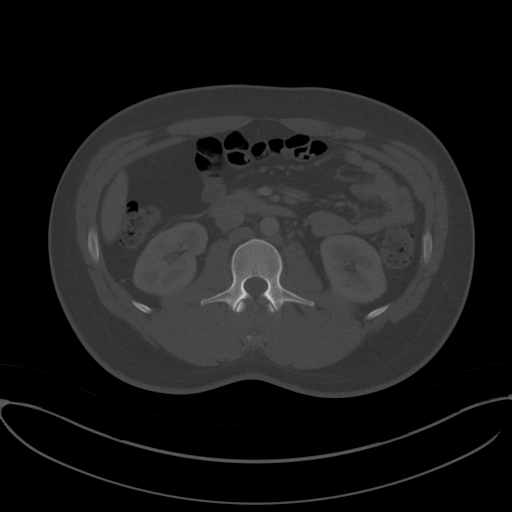
[im 79/107  soft-tissue]
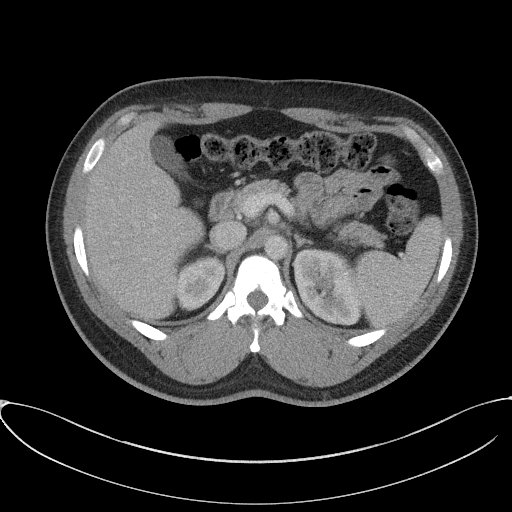
[im 84/107  soft-tissue]
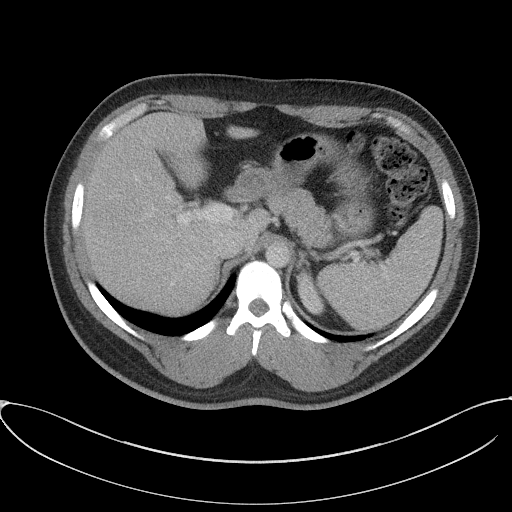
[im 90/107  soft-tissue]
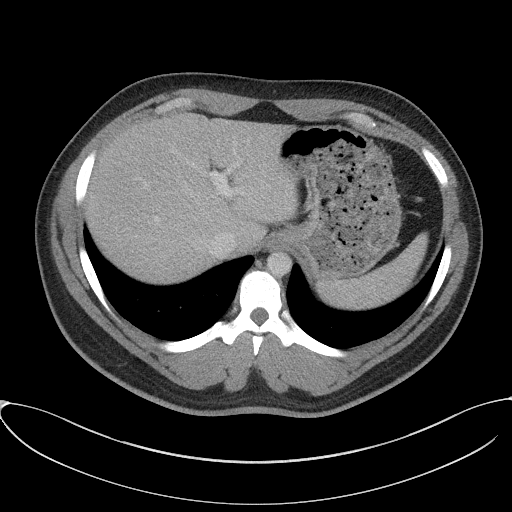
[im 101/107  soft-tissue]
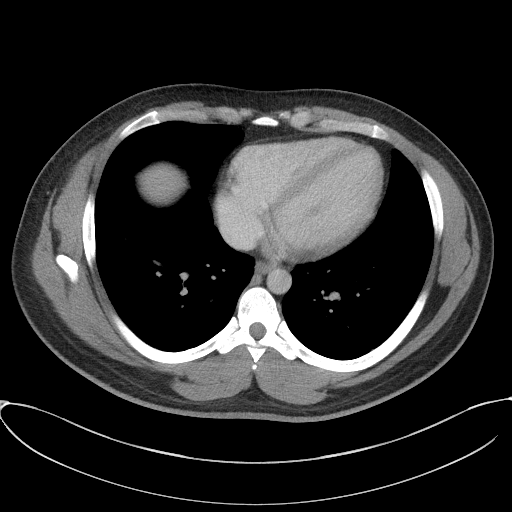

[Series 6: a/p w/ cor · coronal · 0.94mm/px · 3 of 151 slices shown]
[im 51/151  soft-tissue]
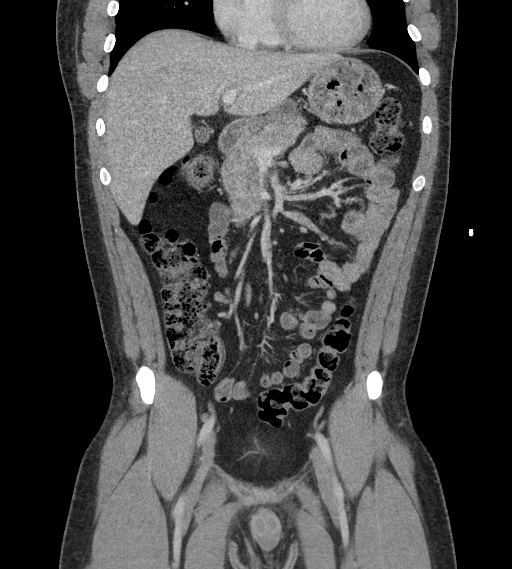
[im 67/151  soft-tissue]
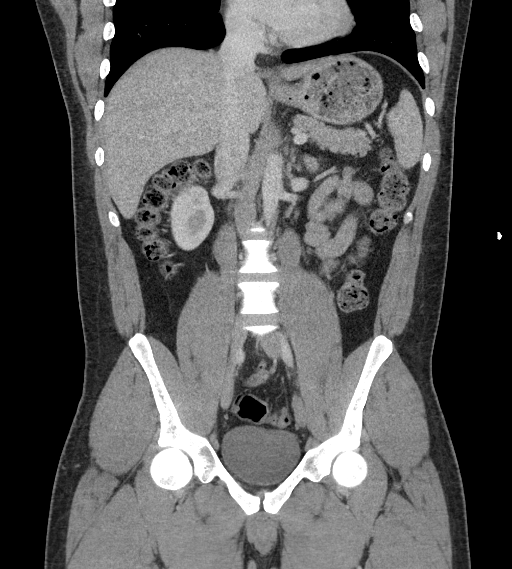
[im 84/151  soft-tissue]
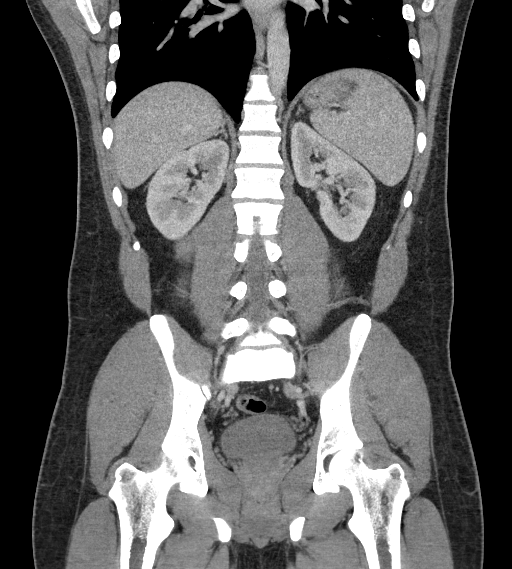

[16 of 46 positions shown; findings below may reference images not displayed]

FINDINGS: Lower chest: The lung bases are clear.

Hepatobiliary: No focal liver abnormality is seen. No gallstones,
gallbladder wall thickening, or biliary dilatation.

Pancreas: No ductal dilatation or inflammation.

Spleen: Normal in size without focal abnormality.

Adrenals/Urinary Tract: Normal adrenal glands. No hydronephrosis or
perinephric edema. Homogeneous renal enhancement with symmetric
excretion on delayed phase imaging. No suspicious renal mass.
Urinary bladder is physiologically distended without wall
thickening.

Stomach/Bowel: Bowel evaluation is limited in the absence of enteric
contrast. Ingested material in the stomach. No bowel wall
thickening, inflammatory change, or obstruction. Normal appendix.
Moderate colonic stool burden. No colonic wall thickening or
inflammatory change.

Vascular/Lymphatic: Enlarged right inguinal node measures 1.5 cm
short axis. Additional smaller nodes in the right inguinal and
external iliac stations. No retroperitoneal adenopathy. Normal
caliber abdominal aorta. Mesenteric vessels and portal vein are
intact.

Reproductive: Prostate is unremarkable.

Other: Within the subcutaneous tissues of the anterior right lower
abdominal wall is a 1.4 x 1.9 cm peripherally enhancing ovoid
complex fluid collection with surrounding soft tissue stranding. No
internal air. This appears centered in the subcutaneous tissues
rather than the dermis. Small fat containing umbilical and
supraumbilical ventral abdominal wall hernias without bowel
involvement or inflammatory change. No free fluid or free air in the
abdomen or pelvis.

Musculoskeletal: There are no acute or suspicious osseous
abnormalities.
IMPRESSION: 1. Complex 1.4 x 1.9 cm peripherally enhancing fluid collection with
surrounding soft tissue stranding in the right lower anterior
abdominal wall subcutaneous tissues. Findings are suspicious for
abscess. Superimposed infection or necrosis of a pre-existing
subcutaneous lesion is also considered given this is reportedly been
present for months. Recommend fluid/tissue sampling.
2. Right inguinal and external iliac adenopathy is likely reactive.
3. Small fat containing umbilical and supraumbilical ventral
abdominal wall hernias without bowel involvement or inflammatory
change.

## 2023-06-18 ENCOUNTER — Emergency Department (HOSPITAL_BASED_OUTPATIENT_CLINIC_OR_DEPARTMENT_OTHER)
Admission: EM | Admit: 2023-06-18 | Discharge: 2023-06-18 | Disposition: A | Discharge status: Home / Self Care | Payer: Medicaid Other | Attending: Emergency Medicine | Admitting: Emergency Medicine

## 2023-06-18 ENCOUNTER — Emergency Department (HOSPITAL_BASED_OUTPATIENT_CLINIC_OR_DEPARTMENT_OTHER): Payer: Medicaid Other | Admitting: Radiology

## 2023-06-18 ENCOUNTER — Other Ambulatory Visit: Payer: Self-pay

## 2023-06-18 ENCOUNTER — Encounter (HOSPITAL_BASED_OUTPATIENT_CLINIC_OR_DEPARTMENT_OTHER): Payer: Self-pay | Admitting: Emergency Medicine

## 2023-06-18 DIAGNOSIS — Z23 Encounter for immunization: Secondary | ICD-10-CM | POA: Diagnosis not present

## 2023-06-18 DIAGNOSIS — W25XXXA Contact with sharp glass, initial encounter: Secondary | ICD-10-CM | POA: Insufficient documentation

## 2023-06-18 DIAGNOSIS — S61411A Laceration without foreign body of right hand, initial encounter: Secondary | ICD-10-CM | POA: Diagnosis not present

## 2023-06-18 DIAGNOSIS — S60921A Unspecified superficial injury of right hand, initial encounter: Secondary | ICD-10-CM | POA: Diagnosis present

## 2023-06-18 MED ORDER — LIDOCAINE HCL (PF) 1 % IJ SOLN
5.0000 mL | Freq: Once | INTRAMUSCULAR | Status: AC
  Administered 2023-06-18: 5 mL
  Filled 2023-06-18: qty 5

## 2023-06-18 MED ORDER — ACETAMINOPHEN 500 MG PO TABS
1000.0000 mg | ORAL_TABLET | Freq: Once | ORAL | Status: AC
  Administered 2023-06-18: 1000 mg via ORAL
  Filled 2023-06-18: qty 2

## 2023-06-18 MED ORDER — LIDOCAINE-EPINEPHRINE-TETRACAINE (LET) TOPICAL GEL
3.0000 mL | Freq: Once | TOPICAL | Status: AC
  Administered 2023-06-18: 3 mL via TOPICAL
  Filled 2023-06-18: qty 3

## 2023-06-18 MED ORDER — IBUPROFEN 400 MG PO TABS
600.0000 mg | ORAL_TABLET | Freq: Once | ORAL | Status: AC
  Administered 2023-06-18: 600 mg via ORAL
  Filled 2023-06-18: qty 1

## 2023-06-18 MED ORDER — TETANUS-DIPHTH-ACELL PERTUSSIS 5-2.5-18.5 LF-MCG/0.5 IM SUSY
0.5000 mL | PREFILLED_SYRINGE | Freq: Once | INTRAMUSCULAR | Status: AC
  Administered 2023-06-18: 0.5 mL via INTRAMUSCULAR
  Filled 2023-06-18: qty 0.5

## 2023-06-18 NOTE — Discharge Instructions (Addendum)
You were seen in the ER for evaluation of your hand laceration. I am glad we were able to repair this for you today. Please make sure you return to your primary care provider, urgent care, or the ER in 10 days for removal. Please make sure you are cleaning your wound at least once daily and when visiably soiled with Dial soap and water. I recommend leaving the area covered, with daily bandage changes for 48-72 hours. I would avoid contact with any dirty water such as dirty dishwater, pools, lakes, Jacuzzis, etc.  Still okay to shower however do not get your hands wet enough to make them prune.  It is important to make sure the wound is staying clean to avoid infection.  You can take Tylenol or ibuprofen as needed for pain. Additionally, your tetanus shot was updated today.  If you have any concerns of any worsening symptoms, please return to your nearest emergency department for evaluation.  Contact a doctor if: You got a tetanus shot and you have any of these problems where the needle went in: Swelling. Very bad pain. Redness. Bleeding. A wound that was closed breaks open. You have a fever. You have any of these signs of infection in your wound: More redness, swelling, or pain. Fluid or blood. Warmth. Pus or a bad smell. You see something coming out of the wound, such as wood or glass. Medicine does not make your pain go away. You notice a change in the color of your skin near your wound. You need to change the bandage often. You have a new rash. You lose feeling (have numbness) around the wound. Get help right away if: You have very bad swelling around the wound. Your pain suddenly gets worse and is very bad. You have painful lumps near the wound or on skin anywhere on your body. You have a red streak going away from your wound. The wound is on your hand or foot, and: You cannot move a finger or toe. Your fingers or toes look pale or bluish.

## 2023-06-18 NOTE — ED Provider Notes (Signed)
[place] EMERGENCY DEPARTMENT AT [facility] Provider Note   CSN: [id number] Arrival date & time: 06/18/23  1425     History Chief Complaint  Patient presents with   Extremity Laceration    Benjamin Wise is a 32 y.o. male reportedly otherwise healthy presents to the ER for evaluation of laceration to the right palm.  Patient reports that he was moving a couch when the couch bumped into globe light fixture above.  Patient reports that it cracked into and he caught one of the pieces and cut it in his hand.  He denies removing any pieces from the wound.  He is unknown when his last tetanus was.  He denies any numbness or tingling into the hand.  He reports he is ambidextrous however is mainly right-handed.  HPI     Home Medications Prior to Admission medications   Medication Sig Start Date End Date Taking? Authorizing Provider  acetaminophen (TYLENOL) 500 MG tablet Take 1,000 mg by mouth every 6 (six) hours as needed for mild pain.     [provider]  doxycycline (VIBRAMYCIN) 100 MG capsule Take 1 capsule (100 mg total) by mouth 2 (two) times daily. 09/27/18   Ward, Chase Picket, PA-C  ibuprofen (ADVIL,MOTRIN) 800 MG tablet Take 1 tablet (800 mg total) by mouth every 8 (eight) hours as needed. Patient taking differently: Take 800 mg by mouth every 8 (eight) hours as needed for moderate pain.  09/27/18   Ward, Chase Picket, PA-C  naproxen sodium (ALEVE) 220 MG tablet Take 220 mg by mouth every 8 (eight) hours as needed (pain).    [provider]      Allergies    Patient has no known allergies.    Review of Systems   Review of Systems  Skin:  Positive for wound.  See HPI  Physical Exam Updated Vital Signs BP 124/89   Pulse 78   Temp 98.4 F (36.9 C)   Resp 18   Ht 5\' 9"  (1.753 m)   Wt 108.9 kg   SpO2 98%   BMI 35.44 kg/m  Physical Exam Vitals and nursing note reviewed.  Constitutional:      General: He is not in acute distress.     Appearance: Normal appearance. He is not ill-appearing or toxic-appearing.  Eyes:     General: No scleral icterus. Pulmonary:     Effort: Pulmonary effort is normal. No respiratory distress.  Musculoskeletal:     Comments: 4 cm laceration seen to the palmar aspect of the right hand.  Some subcutaneous tissue seen however no tendons or ligaments are muscles seen.  Patient has full flexion extension of the fingers and AB and adduction of the thumb.  Brisk refill present in all 5 fingers and palpable radial pulses.  Compartments are soft.  Reportedly sensations intact distally.  Skin:    General: Skin is dry.  Neurological:     General: No focal deficit present.     Mental Status: He is alert. Mental status is at baseline.  Psychiatric:        Mood and Affect: Mood normal.     ED Results / Procedures / Treatments   Labs (all labs ordered are listed, but only abnormal results are displayed) Labs Reviewed - No data to display  EKG None  Radiology DG Hand Complete Right  Result Date: 06/18/2023 CLINICAL DATA:  Anterior right hand glass laceration. EXAM: RIGHT HAND - COMPLETE 3+ VIEW COMPARISON:  06/10/2008 FINDINGS: Interval old,  healed distal 4th and 5th metacarpal fractures. No acute fracture or dislocation. No radiopaque foreign body. IMPRESSION: 1. No acute fracture or radiopaque foreign body. 2. Old, healed distal 4th and 5th metacarpal fractures. Electronically Signed   By: Beckie Salts M.D.   On: 06/18/2023 15:09    Procedures .Marland KitchenLaceration Repair  Date/Time: 06/18/2023 5:41 PM  Performed by: Achille Rich, PA-C Authorized by: Achille Rich, PA-C   Consent:    Consent obtained:  Verbal   Consent given by:  Patient   Risks, benefits, and alternatives were discussed: yes     Risks discussed:  Infection, pain, nerve damage, need for additional repair, retained foreign body, poor cosmetic result, tendon damage and poor wound healing   Alternatives discussed:  No treatment and  delayed treatment Universal protocol:    Procedure explained and questions answered to patient or proxy's satisfaction: yes     Imaging studies available: yes     Patient identity confirmed:  Verbally with patient Anesthesia:    Anesthesia method:  Topical application and local infiltration   Topical anesthetic:  LET   Local anesthetic:  Lidocaine 1% w/o epi Laceration details:    Location:  Hand   Hand location:  R palm   Length (cm):  4 Pre-procedure details:    Preparation:  Patient was prepped and draped in usual sterile fashion and imaging obtained to evaluate for foreign bodies Exploration:    Imaging obtained: x-ray     Imaging outcome: foreign body not noted     Wound exploration: wound explored through full range of motion and entire depth of wound visualized     Wound extent: no foreign body, no signs of injury and no underlying fracture     Contaminated: no   Treatment:    Area cleansed with:  Shur-Clens   Amount of cleaning:  Standard Skin repair:    Repair method:  Sutures   Suture size:  4-0   Suture material:  Prolene   Suture technique:  Simple interrupted   Number of sutures:  7 Approximation:    Approximation:  Close Repair type:    Repair type:  Simple Post-procedure details:    Dressing:  Antibiotic ointment and non-adherent dressing   Procedure completion:  Tolerated well, no immediate complications Comments:     I thoroughly investigated the wound do not appreciate any retained glass or foreign body.  I did discuss with the patient that even though with the I am with x-ray, there still could be retained foreign bodies.  He accepts this and would still like to proceed with the suture repair.    Medications Ordered in ED Medications  Tdap (BOOSTRIX) injection 0.5 mL (0.5 mLs Intramuscular Given 06/18/23 1534)  ibuprofen (ADVIL) tablet 600 mg (600 mg Oral Given 06/18/23 1535)  acetaminophen (TYLENOL) tablet 1,000 mg (1,000 mg Oral Given 06/18/23 1535)   lidocaine-EPINEPHrine-tetracaine (LET) topical gel (3 mLs Topical Given 06/18/23 1533)  lidocaine (PF) (XYLOCAINE) 1 % injection 5 mL (5 mLs Other Given 06/18/23 1535)    ED Course/ Medical Decision Making/ A&P    Medical Decision Making Amount and/or Complexity of Data Reviewed Radiology: ordered.  Risk OTC drugs. Prescription drug management.   32 y.o. male presents to the ER for evaluation of right palm laceration. Differential diagnosis includes but is not limited to laceration versus retained foreign body versus tendon ligament injury. Vital signs unremarkable. Physical exam as noted above.   On previous chart evaluation, patient was last tetanus shot was  updated in 2012, will need update here.  Also will give him some Tylenol ibuprofen for pain.  Let was applied as the patient reports that he is "difficult to numb up".  Will also use lidocaine during procedure as well.  X-ray imaging shows 1. No acute fracture or radiopaque foreign body. 2. Old, healed distal 4th and 5th metacarpal fractures.   Please see procedure note for additional information.  Patient has full flexion-extension of the fingers and is neurovascular intact distally.  Recommended leaving the sutures in for the next 10 days.  We discussed wound cleaning is well as what to avoid.   We discussed the results of the labs/imaging. The plan is wound care, supportive care, return in 10 days for suture removal. We discussed strict return precautions and red flag symptoms. The patient verbalized their understanding and agrees to the plan. The patient is stable and being discharged home in good condition.  Portions of this report may have been transcribed using voice recognition software. Every effort was made to ensure accuracy; however, inadvertent computerized transcription errors may be present.   Final Clinical Impression(s) / ED Diagnoses Final diagnoses:  Laceration of right palm, initial encounter    Rx / DC  Orders ED Discharge Orders     None         Achille Rich, PA-C 06/18/23 1743    Melene Plan, DO 06/19/23 [phone]

## 2023-06-18 NOTE — ED Notes (Signed)
 RN reviewed discharge instructions with pt. Pt verbalized understanding and had no further questions. VSS upon discharge.  

## 2023-06-18 NOTE — ED Triage Notes (Signed)
Pt reports laceration to RT anterior hand today by glass from chandelier globe. Last tetanus unknown. Bleeding controlled
